# Patient Record
Sex: Male | Born: 1955 | Race: White | Hispanic: No | Marital: Married | Smoking: Never smoker
Health system: Southern US, Community
[De-identification: ages and names within clinical notes are randomized; demographics above are authoritative.]

## PROBLEM LIST (undated history)

## (undated) DIAGNOSIS — L0291 Cutaneous abscess, unspecified: Secondary | ICD-10-CM

## (undated) DIAGNOSIS — I10 Essential (primary) hypertension: Secondary | ICD-10-CM

## (undated) DIAGNOSIS — E785 Hyperlipidemia, unspecified: Secondary | ICD-10-CM

## (undated) HISTORY — PX: COLONOSCOPY W/ POLYPECTOMY: SHX1380

## (undated) HISTORY — DX: Cutaneous abscess, unspecified: L02.91

## (undated) HISTORY — DX: Essential (primary) hypertension: I10

## (undated) HISTORY — PX: TONSILLECTOMY: SUR1361

## (undated) HISTORY — DX: Hyperlipidemia, unspecified: E78.5

---

## 2006-06-29 ENCOUNTER — Encounter: Admission: RE | Admit: 2006-06-29 | Discharge: 2006-06-29 | Payer: Self-pay | Admitting: Family Medicine

## 2011-04-08 ENCOUNTER — Encounter (INDEPENDENT_AMBULATORY_CARE_PROVIDER_SITE_OTHER): Payer: Self-pay | Admitting: General Surgery

## 2011-04-08 ENCOUNTER — Other Ambulatory Visit (INDEPENDENT_AMBULATORY_CARE_PROVIDER_SITE_OTHER): Payer: Self-pay | Admitting: General Surgery

## 2011-04-08 ENCOUNTER — Telehealth (INDEPENDENT_AMBULATORY_CARE_PROVIDER_SITE_OTHER): Payer: Self-pay | Admitting: General Surgery

## 2011-04-08 ENCOUNTER — Ambulatory Visit (INDEPENDENT_AMBULATORY_CARE_PROVIDER_SITE_OTHER): Payer: 59 | Admitting: General Surgery

## 2011-04-08 VITALS — BP 118/80 | HR 84 | Temp 98.2°F | Resp 12 | Ht 65.0 in | Wt 165.0 lb

## 2011-04-08 DIAGNOSIS — L03119 Cellulitis of unspecified part of limb: Secondary | ICD-10-CM

## 2011-04-08 DIAGNOSIS — L02419 Cutaneous abscess of limb, unspecified: Secondary | ICD-10-CM

## 2011-04-08 MED ORDER — HYDROCODONE-ACETAMINOPHEN 5-500 MG PO TABS: 1.0000 | ORAL_TABLET | Freq: Every day | ORAL | Status: AC

## 2011-04-08 NOTE — Patient Instructions (Signed)
Remove packing in 3 days.  At that time may shower and pat wound dry.  Remove all soap from the wound.  Keep covered at all times.  No seawater exposure or pool water exposure,

## 2011-04-08 NOTE — Progress Notes (Signed)
HPI The patient comes in with a large right thigh abscess after weak bruit for several days. He has been on Septra antibiotics but it is worsened while on antibiotics. He may have resulted from a previous insect bite  PE About 10 cm in circumference abscess of the right anterior thigh. It is spontaneously draining dark brown pus. Cultures were sent.  Studiy review None  Assessment Large right thigh abscess  Plan And incision and drainage was done and a Betadine prep. Several cc of mucopurulent fluid was removed. Aerobic cultures were sent. The patient will remain on his Septra therapy. I did pack it with about 2 feet of half-inch iodoform gauze. This is to be removed in 3 days.  Unfortunately the patient is leaving to go out of the country tomorrow. I have advised him not to get in contact with any type of sea water were water however he can shower and pat the wound dry after he does remove the packing in 3 days.  The patient should come back to see [place] after he is done with his vacation  I will provide him with some Vicodin for pain control.

## 2011-04-11 LAB — WOUND CULTURE: Gram Stain: NONE SEEN

## 2011-04-19 ENCOUNTER — Ambulatory Visit (INDEPENDENT_AMBULATORY_CARE_PROVIDER_SITE_OTHER): Payer: 59 | Admitting: General Surgery

## 2011-04-19 ENCOUNTER — Encounter (INDEPENDENT_AMBULATORY_CARE_PROVIDER_SITE_OTHER): Payer: Self-pay | Admitting: General Surgery

## 2011-04-19 VITALS — BP 122/82 | HR 70 | Temp 98.2°F | Resp 16 | Ht 65.0 in | Wt 168.2 lb

## 2011-04-19 DIAGNOSIS — L03119 Cellulitis of unspecified part of limb: Secondary | ICD-10-CM

## 2011-04-19 DIAGNOSIS — L02419 Cutaneous abscess of limb, unspecified: Secondary | ICD-10-CM

## 2011-04-19 NOTE — Progress Notes (Signed)
HPI The patient's discomfort has significantly improved.  PE The amount of swelling and redness around the wound has significantly improved on his right side. It is still open in the center however he has been using peroxide to cleanse it.  Studiy review None.  Assessment Improved the right thigh abscess.  Plan Return to see me in 3 weeks for reassessment of the wound. He can discontinue his antibiotics. Should not use peroxide for the wound the distal water patted dry and then do a dressing change subsequently.

## 2011-06-07 ENCOUNTER — Encounter (INDEPENDENT_AMBULATORY_CARE_PROVIDER_SITE_OTHER): Payer: 59 | Admitting: General Surgery

## 2014-09-24 ENCOUNTER — Other Ambulatory Visit: Payer: Self-pay | Admitting: Gastroenterology

## 2014-11-28 ENCOUNTER — Other Ambulatory Visit: Payer: Self-pay | Admitting: Gastroenterology

## 2014-12-09 ENCOUNTER — Encounter (HOSPITAL_COMMUNITY): Payer: Self-pay | Admitting: *Deleted

## 2014-12-11 ENCOUNTER — Other Ambulatory Visit: Payer: Self-pay | Admitting: Gastroenterology

## 2014-12-14 ENCOUNTER — Encounter (HOSPITAL_COMMUNITY): Payer: Self-pay | Admitting: Anesthesiology

## 2014-12-14 NOTE — Anesthesia Preprocedure Evaluation (Deleted)
Anesthesia Evaluation  Patient identified by MRN, date of birth, ID band Patient awake    Reviewed: Allergy & Precautions, NPO status , Patient's Chart, lab work & pertinent test results  History of Anesthesia Complications Negative for: history of anesthetic complications  Airway Mallampati: II  TM Distance: >3 FB Neck ROM: Full    Dental no notable dental hx. (+) Dental Advisory Given   Pulmonary neg pulmonary ROS,  breath sounds clear to auscultation  Pulmonary exam normal       Cardiovascular hypertension, Pt. on medications Normal cardiovascular examRhythm:Regular Rate:Normal     Neuro/Psych negative neurological ROS  negative psych ROS   GI/Hepatic negative GI ROS, Neg liver ROS,   Endo/Other  negative endocrine ROS  Renal/GU negative Renal ROS  negative genitourinary   Musculoskeletal negative musculoskeletal ROS (+)   Abdominal   Peds negative pediatric ROS (+)  Hematology negative hematology ROS (+)   Anesthesia Other Findings   Reproductive/Obstetrics negative OB ROS                             Anesthesia Physical Anesthesia Plan  ASA: II  Anesthesia Plan: MAC   Post-op Pain Management:    Induction: Intravenous  Airway Management Planned: Nasal Cannula  Additional Equipment:   Intra-op Plan:   Post-operative Plan:   Informed Consent: I have reviewed the patients History and Physical, chart, labs and discussed the procedure including the risks, benefits and alternatives for the proposed anesthesia with the patient or authorized representative who has indicated his/her understanding and acceptance.   Dental advisory given  Plan Discussed with: CRNA  Anesthesia Plan Comments:         Anesthesia Quick Evaluation  

## 2014-12-15 ENCOUNTER — Other Ambulatory Visit: Payer: Self-pay | Admitting: Gastroenterology

## 2014-12-15 ENCOUNTER — Ambulatory Visit (HOSPITAL_COMMUNITY)
Admission: RE | Admit: 2014-12-15 | Payer: PRIVATE HEALTH INSURANCE | Source: Ambulatory Visit | Admitting: Gastroenterology

## 2014-12-15 SURGERY — COLONOSCOPY WITH PROPOFOL
Anesthesia: Monitor Anesthesia Care

## 2015-03-03 ENCOUNTER — Encounter (HOSPITAL_COMMUNITY): Admission: RE | Payer: Self-pay | Source: Ambulatory Visit

## 2015-03-03 ENCOUNTER — Ambulatory Visit (HOSPITAL_COMMUNITY)
Admission: RE | Admit: 2015-03-03 | Payer: PRIVATE HEALTH INSURANCE | Source: Ambulatory Visit | Admitting: Gastroenterology

## 2015-03-03 SURGERY — COLONOSCOPY WITH PROPOFOL
Anesthesia: Monitor Anesthesia Care

## 2019-02-15 ENCOUNTER — Emergency Department (HOSPITAL_COMMUNITY): Payer: 59

## 2019-02-15 ENCOUNTER — Emergency Department (HOSPITAL_COMMUNITY)
Admission: EM | Admit: 2019-02-15 | Discharge: 2019-02-16 | Disposition: A | Discharge status: Home / Self Care | Payer: 59 | Attending: Emergency Medicine | Admitting: Emergency Medicine

## 2019-02-15 DIAGNOSIS — R079 Chest pain, unspecified: Secondary | ICD-10-CM

## 2019-02-15 DIAGNOSIS — R0789 Other chest pain: Secondary | ICD-10-CM | POA: Insufficient documentation

## 2019-02-15 DIAGNOSIS — I10 Essential (primary) hypertension: Secondary | ICD-10-CM | POA: Diagnosis not present

## 2019-02-15 LAB — BASIC METABOLIC PANEL
Anion gap: 9 (ref 5–15)
BUN: 17 mg/dL (ref 8–23)
CO2: 23 mmol/L (ref 22–32)
Calcium: 9.8 mg/dL (ref 8.9–10.3)
Chloride: 106 mmol/L (ref 98–111)
Creatinine, Ser: 1.19 mg/dL (ref 0.61–1.24)
GFR calc Af Amer: >60 mL/min (ref >60)
GFR calc non Af Amer: >60 mL/min (ref >60)
Glucose, Bld: 115 mg/dL — ABNORMAL HIGH (ref 70–99)
Potassium: 4.4 mmol/L (ref 3.5–5.1)
Sodium: 138 mmol/L (ref 135–145)

## 2019-02-15 LAB — CBC
HCT: 43.9 % (ref 39.0–52.0)
Hemoglobin: 14.4 g/dL (ref 13.0–17.0)
MCH: 29.1 pg (ref 26.0–34.0)
MCHC: 32.8 g/dL (ref 30.0–36.0)
MCV: 88.9 fL (ref 80.0–100.0)
Platelets: 256 10*3/uL (ref 150–400)
RBC: 4.94 MIL/uL (ref 4.22–5.81)
RDW: 13.6 % (ref 11.5–15.5)
WBC: 7.6 10*3/uL (ref 4.0–10.5)
nRBC: 0 % (ref 0.0–0.2)

## 2019-02-15 LAB — TROPONIN I (HIGH SENSITIVITY): Troponin I (High Sensitivity): 3 ng/L (ref <18)

## 2019-02-15 MED ORDER — SODIUM CHLORIDE 0.9% FLUSH: 3.0000 mL | Freq: Once | INTRAVENOUS | Status: DC

## 2019-02-15 NOTE — ED Triage Notes (Signed)
Chest pain began yesterday, has gotten progressively worse, feels like something "pressing through to my back"

## 2019-02-15 NOTE — ED Notes (Signed)
PT states he is going outside and will return.

## 2019-02-16 LAB — TROPONIN I (HIGH SENSITIVITY): Troponin I (High Sensitivity): 3 ng/L (ref <18)

## 2019-02-16 NOTE — ED Notes (Signed)
C/o chest pain onset thurs with radiation to left arm and jaw, states he used Tylenol and Tums without relief. States pain is 3/10 at present.

## 2019-02-16 NOTE — Discharge Instructions (Addendum)
You were evaluated in the Emergency Department and after careful evaluation, we did not find any emergent condition requiring admission or further testing in the hospital.  Your exam/testing today was overall reassuring.  As discussed, we recommend outpatient cardiac stress testing within the next 1 to 2 weeks.  If your PCP is unable to schedule this, please contact the cardiologist office number provided.  Please return to the Emergency Department if you experience any worsening of your condition.  We encourage you to follow up with a primary care provider.  Thank you for allowing [place] to be a part of your care.

## 2019-02-16 NOTE — ED Provider Notes (Signed)
[facility] Emergency Department Provider Note MRN:  [id number]  Arrival date & time: 02/16/19     Chief Complaint   Chest Pain   History of Present Illness   Alejandro Edwards is a 63 y.o. year-old male with a history of hypertension, hyperlipidemia presenting to the ED with chief complaint of chest pain.  2 days ago patient began experiencing pain in the left arm radiating up into the left side of the chest.  Pain is described as a pressure.  At times worse with exertion.  Mild in severity.  Associated with nausea.  Denies shortness of breath, no leg pain or swelling, no fever, no cough, no abdominal pain.  No other exacerbating or alleviating factors.  Review of Systems  A complete 10 system review of systems was obtained and all systems are negative except as noted in the HPI and PMH.   Patient's Health History    Past Medical History:  Diagnosis Date  . Abscess    rt thigh-resolved-not a problem on 12-09-14  . Hyperlipidemia   . Hypertension     Past Surgical History:  Procedure Laterality Date  . COLONOSCOPY W/ POLYPECTOMY    . TONSILLECTOMY      No family history on file.  Social History   Socioeconomic History  . Marital status: Married    Spouse name: Not on file  . Number of children: Not on file  . Years of education: Not on file  . Highest education level: Not on file  Occupational History  . Not on file  Social Needs  . Financial resource strain: Not on file  . Food insecurity    Worry: Not on file    Inability: Not on file  . Transportation needs    Medical: Not on file    Non-medical: Not on file  Tobacco Use  . Smoking status: Never Smoker  Substance and Sexual Activity  . Alcohol use: Yes    Comment: occ. weekends  . Drug use: No  . Sexual activity: Not on file  Lifestyle  . Physical activity    Days per week: Not on file    Minutes per session: Not on file  . Stress: Not on file  Relationships  . Social Wellsite geologistconnections     Talks on phone: Not on file    Gets together: Not on file    Attends religious service: Not on file    Active member of club or organization: Not on file    Attends meetings of clubs or organizations: Not on file    Relationship status: Not on file  . Intimate partner violence    Fear of current or ex partner: Not on file    Emotionally abused: Not on file    Physically abused: Not on file    Forced sexual activity: Not on file  Other Topics Concern  . Not on file  Social History Narrative  . Not on file     Physical Exam  Vital Signs and Nursing Notes reviewed Vitals:   02/16/19 0416 02/16/19 0738  BP: 115/75 128/82  Pulse: 77 72  Resp: 18 16  Temp:  (!) 97.3 F (36.3 C)  SpO2: 100% 100%    CONSTITUTIONAL: Well-appearing, NAD NEURO:  Alert and oriented x 3, no focal deficits EYES:  eyes equal and reactive ENT/NECK:  no LAD, no JVD CARDIO: Regular rate, well-perfused, normal S1 and S2 PULM:  CTAB no wheezing or rhonchi GI/GU:  normal bowel sounds,  non-distended, non-tender MSK/SPINE:  No gross deformities, no edema SKIN:  no rash, atraumatic PSYCH:  Appropriate speech and behavior  Diagnostic and Interventional Summary    EKG Interpretation  Date/Time:  Friday February 15 2019 17:43:50 EDT Ventricular Rate:  84 PR Interval:  164 QRS Duration: 84 QT Interval:  360 QTC Calculation: 425 R Axis:   35 Text Interpretation:  Normal sinus rhythm Nonspecific ST abnormality Abnormal ECG Confirmed by Gerlene Fee [phone]) on 02/16/2019 7:32:55 AM      Labs Reviewed  BASIC METABOLIC PANEL - Abnormal; Notable for the following components:      Result Value   Glucose, Bld 115 (*)    All other components within normal limits  CBC  TROPONIN I (HIGH SENSITIVITY)  TROPONIN I (HIGH SENSITIVITY)    DG Chest 2 View  Final Result      Medications  sodium chloride flush (NS) 0.9 % injection 3 mL (has no administration in time range)     Procedures Critical Care   ED Course and Medical Decision Making  I have reviewed the triage vital signs and the nursing notes.  Pertinent labs & imaging results that were available during my care of the patient were reviewed by me and considered in my medical decision making (see below for details).  2 days of chest pain, patient has a heart score of 3 and while his description of pain is somewhat typical, he has no ischemic EKG findings and his troponins are negative x2.  No leg pain or swelling, no hypoxia, no shortness of breath, nothing to suggest PE.  Equal pulses, normal mediastinum on chest x-ray, nothing to suggest dissection.  There is no indication for further testing or admission to the hospital.  Patient would mostly benefit from outpatient stress testing.  Discussed this with patient, who agrees to follow-up with PCP to schedule outpatient cardiac stress testing within the next 1 to 2 weeks.  Patient advised to avoid strenuous activity.  Strict return precautions for worsening chest pain.    Barth Kirks. Sedonia Small, [place] [email or web address]  Final Clinical Impressions(s) / ED Diagnoses     ICD-10-CM   1. Chest pain, unspecified type  R07.9     ED Discharge Orders    None      Discharge Instructions Discussed with and Provided to Patient:   Discharge Instructions     You were evaluated in the Emergency Department and after careful evaluation, we did not find any emergent condition requiring admission or further testing in the hospital.  Your exam/testing today was overall reassuring.  As discussed, we recommend outpatient cardiac stress testing within the next 1 to 2 weeks.  If your PCP is unable to schedule this, please contact the cardiologist office number provided.  Please return to the Emergency Department if you experience any worsening of your condition.  We encourage you to follow up with a primary care provider.  Thank you for allowing [place]  to be a part of your care.        Maudie Flakes, MD 02/16/19 [phone]

## 2019-02-21 ENCOUNTER — Other Ambulatory Visit (HOSPITAL_COMMUNITY): Payer: Self-pay | Admitting: Physician Assistant

## 2019-02-21 DIAGNOSIS — R079 Chest pain, unspecified: Secondary | ICD-10-CM

## 2019-02-22 ENCOUNTER — Telehealth (HOSPITAL_COMMUNITY): Payer: Self-pay

## 2019-02-22 NOTE — Telephone Encounter (Signed)
Encounter complete. 

## 2019-02-25 ENCOUNTER — Other Ambulatory Visit (HOSPITAL_COMMUNITY)
Admission: RE | Admit: 2019-02-25 | Discharge: 2019-02-25 | Disposition: A | Discharge status: Home / Self Care | Payer: 59 | Source: Ambulatory Visit | Attending: Cardiology | Admitting: Cardiology

## 2019-02-25 DIAGNOSIS — Z01812 Encounter for preprocedural laboratory examination: Secondary | ICD-10-CM | POA: Insufficient documentation

## 2019-02-25 DIAGNOSIS — Z20828 Contact with and (suspected) exposure to other viral communicable diseases: Secondary | ICD-10-CM | POA: Diagnosis not present

## 2019-02-26 LAB — NOVEL CORONAVIRUS, NAA (HOSP ORDER, SEND-OUT TO REF LAB; TAT 18-24 HRS): SARS-CoV-2, NAA: NOT DETECTED

## 2019-02-28 ENCOUNTER — Encounter (INDEPENDENT_AMBULATORY_CARE_PROVIDER_SITE_OTHER): Payer: Self-pay

## 2019-02-28 ENCOUNTER — Ambulatory Visit (HOSPITAL_COMMUNITY)
Admission: RE | Admit: 2019-02-28 | Discharge: 2019-02-28 | Disposition: A | Discharge status: Home / Self Care | Payer: 59 | Source: Ambulatory Visit | Attending: Cardiology | Admitting: Cardiology

## 2019-02-28 ENCOUNTER — Other Ambulatory Visit: Payer: Self-pay

## 2019-02-28 DIAGNOSIS — R079 Chest pain, unspecified: Secondary | ICD-10-CM | POA: Diagnosis not present

## 2019-02-28 LAB — EXERCISE TOLERANCE TEST
Estimated workload: 10.7 METS
Exercise duration (min): 9 min
Exercise duration (sec): 21 s
MPHR: 158 {beats}/min
Peak HR: 160 {beats}/min
Percent HR: 101 %
RPE: 17
Rest HR: 88 {beats}/min

## 2019-03-12 ENCOUNTER — Encounter: Payer: Self-pay | Admitting: Cardiovascular Disease

## 2019-03-12 ENCOUNTER — Other Ambulatory Visit (HOSPITAL_COMMUNITY)
Admission: RE | Admit: 2019-03-12 | Discharge: 2019-03-12 | Disposition: A | Discharge status: Home / Self Care | Payer: 59 | Source: Ambulatory Visit | Attending: Cardiovascular Disease | Admitting: Cardiovascular Disease

## 2019-03-12 ENCOUNTER — Other Ambulatory Visit: Payer: Self-pay

## 2019-03-12 ENCOUNTER — Ambulatory Visit (INDEPENDENT_AMBULATORY_CARE_PROVIDER_SITE_OTHER): Payer: 59 | Admitting: Cardiovascular Disease

## 2019-03-12 ENCOUNTER — Other Ambulatory Visit: Payer: Self-pay | Admitting: Cardiovascular Disease

## 2019-03-12 VITALS — BP 126/76 | HR 92 | Temp 97.7°F | Ht 65.5 in

## 2019-03-12 DIAGNOSIS — Z01812 Encounter for preprocedural laboratory examination: Secondary | ICD-10-CM | POA: Insufficient documentation

## 2019-03-12 DIAGNOSIS — I200 Unstable angina: Secondary | ICD-10-CM | POA: Diagnosis not present

## 2019-03-12 DIAGNOSIS — Z20828 Contact with and (suspected) exposure to other viral communicable diseases: Secondary | ICD-10-CM | POA: Insufficient documentation

## 2019-03-12 DIAGNOSIS — R079 Chest pain, unspecified: Secondary | ICD-10-CM

## 2019-03-12 LAB — BASIC METABOLIC PANEL
Anion gap: 9 (ref 5–15)
BUN: 16 mg/dL (ref 8–23)
CO2: 22 mmol/L (ref 22–32)
Calcium: 9 mg/dL (ref 8.9–10.3)
Chloride: 109 mmol/L (ref 98–111)
Creatinine, Ser: 1.05 mg/dL (ref 0.61–1.24)
GFR calc Af Amer: >60 mL/min (ref >60)
GFR calc non Af Amer: >60 mL/min (ref >60)
Glucose, Bld: 120 mg/dL — ABNORMAL HIGH (ref 70–99)
Potassium: 3.9 mmol/L (ref 3.5–5.1)
Sodium: 140 mmol/L (ref 135–145)

## 2019-03-12 LAB — SARS CORONAVIRUS 2 (TAT 6-24 HRS): SARS Coronavirus 2: NEGATIVE

## 2019-03-12 NOTE — Progress Notes (Signed)
CARDIOLOGY CONSULT NOTE       Patient ID: ATZIN BUCHTA MRN: [id number] DOB/AGE: 62-Apr-1957 62 y.o. y.o.  Admit date: (Not on file) Referring Physician: Harle Battiest Primary Physician: Cristino Martes, MD Primary Cardiologist: New Reason for Consultation: Chest Pain  Active Problems:   * No active hospital problems. *   HPI:  63 y.o. with history of HTN, HLD  seen in ER for chest pain 02/16/19 Pain with some worrisome features Left sided exertional radiating to left arm. Pain for 2-3 days. Associated with mild nausea R/O no acute ECG changes He was d/c with no nitro or beta blocker and told to arrange stress testing with PCP  ETT done 02/28/19 reviewed Worrisome for 2 mm upsloping ST depression in inferior lateral leads Considerable exercise induced ventricular ectopy  Since ER visit has had persistent tightness in chest Some dyspnea He works at [employer] and pain Is not positional. No LE edema or abdominal symptoms   ROS All other systems reviewed and negative except as noted above  Past Medical History:  Diagnosis Date  . Abscess    rt thigh-resolved-not a problem on 12-09-14  . Hyperlipidemia   . Hypertension     History reviewed. No pertinent family history.  Social History   Socioeconomic History  . Marital status: Married    Spouse name: Not on file  . Number of children: Not on file  . Years of education: Not on file  . Highest education level: Not on file  Occupational History  . Not on file  Social Needs  . Financial resource strain: Not on file  . Food insecurity    Worry: Not on file    Inability: Not on file  . Transportation needs    Medical: Not on file    Non-medical: Not on file  Tobacco Use  . Smoking status: Never Smoker  . Smokeless tobacco: Never Used  Substance and Sexual Activity  . Alcohol use: Yes    Alcohol/week: 1.0 standard drinks    Types: 1 Cans of beer per week    Comment: occ. weekends  . Drug use: No  . Sexual activity: Not on  file  Lifestyle  . Physical activity    Days per week: Not on file    Minutes per session: Not on file  . Stress: Not on file  Relationships  . Social Herbalist on phone: Not on file    Gets together: Not on file    Attends religious service: Not on file    Active member of club or organization: Not on file    Attends meetings of clubs or organizations: Not on file    Relationship status: Not on file  . Intimate partner violence    Fear of current or ex partner: Not on file    Emotionally abused: Not on file    Physically abused: Not on file    Forced sexual activity: Not on file  Other Topics Concern  . Not on file  Social History Narrative  . Not on file    Past Surgical History:  Procedure Laterality Date  . COLONOSCOPY W/ POLYPECTOMY    . TONSILLECTOMY          Physical Exam: Blood pressure 126/76, pulse 92, temperature 97.7 F (36.5 C), temperature source Temporal, height 5' 5.5" (1.664 m).    Affect appropriate Healthy:  appears stated age 63: normal Neck supple with no adenopathy JVP normal no bruits no thyromegaly Lungs  clear with no wheezing and good diaphragmatic motion Heart:  S1/S2 no murmur, no rub, gallop or click PMI normal Abdomen: benighn, BS positve, no tenderness, no AAA no bruit.  No HSM or HJR Distal pulses intact with no bruits No edema Neuro non-focal Skin warm and dry No muscular weakness   Labs:   Lab Results  Component Value Date   WBC 7.6 02/15/2019   HGB 14.4 02/15/2019   HCT 43.9 02/15/2019   MCV 88.9 02/15/2019   PLT 256 02/15/2019     Radiology: Dg Chest 2 View  Result Date: 02/15/2019 CLINICAL DATA:  Chest pain EXAM: CHEST - 2 VIEW COMPARISON:  None. FINDINGS: The heart size and mediastinal contours are within normal limits. Both lungs are clear. The visualized skeletal structures are unremarkable. IMPRESSION: No active cardiopulmonary disease. Electronically Signed   By: Hetal  Patel   On: 02/15/2019  18:27    EKG: NSR normal ECG    ASSESSMENT AND PLAN:   1. HTN:  Well controlled.  Continue current medications and low sodium Dash type diet.   2. HLD continue statin target LDL 70 or less 3. Chest Pain: with abnormal stress test both in terms of ST segments and ventricular ectopy Start low dose lopressor Left heart cath arrange for am Discussed risks including stroke, MI, CABG bleeding and contrast reaction willing to proceed. Lab called orders written Will get pre cath labs at [facility] today If cath does not Show CAD may need further w/u to include CTA r/o PE and echo   Signed: Emiyah Spraggins 03/12/2019, 3:36 PM   

## 2019-03-12 NOTE — Patient Instructions (Addendum)
Medication Instructions:      Labwork: TODAY  COVID TEST  BMET    Testing/Procedures: Your physician has requested that you have a cardiac catheterization. Cardiac catheterization is used to diagnose and/or treat various heart conditions. Doctors may recommend this procedure for a number of different reasons. The most common reason is to evaluate chest pain. Chest pain can be a symptom of coronary artery disease (CAD), and cardiac catheterization can show whether plaque is narrowing or blocking your heart's arteries. This procedure is also used to evaluate the valves, as well as measure the blood flow and oxygen levels in different parts of your heart. For further information please visit [email or web address]. Please follow instruction sheet, as given.    Follow-Up: Your physician recommends that you schedule a follow-up appointment in: POST CATH    Any Other Special Instructions Will Be Listed Below (If Applicable).     If you need a refill on your cardiac medications before your next appointment, please call your pharmacy.

## 2019-03-12 NOTE — H&P (View-Only) (Signed)
CARDIOLOGY CONSULT NOTE       Patient ID: ATZIN BUCHTA MRN: [id number] DOB/AGE: 62-Apr-1957 62 y.o.  Admit date: (Not on file) Referring Physician: Harle Battiest Primary Physician: Cristino Martes, MD Primary Cardiologist: New Reason for Consultation: Chest Pain  Active Problems:   * No active hospital problems. *   HPI:  63 y.o. with history of HTN, HLD  seen in ER for chest pain 02/16/19 Pain with some worrisome features Left sided exertional radiating to left arm. Pain for 2-3 days. Associated with mild nausea R/O no acute ECG changes He was d/c with no nitro or beta blocker and told to arrange stress testing with PCP  ETT done 02/28/19 reviewed Worrisome for 2 mm upsloping ST depression in inferior lateral leads Considerable exercise induced ventricular ectopy  Since ER visit has had persistent tightness in chest Some dyspnea He works at [employer] and pain Is not positional. No LE edema or abdominal symptoms   ROS All other systems reviewed and negative except as noted above  Past Medical History:  Diagnosis Date  . Abscess    rt thigh-resolved-not a problem on 12-09-14  . Hyperlipidemia   . Hypertension     History reviewed. No pertinent family history.  Social History   Socioeconomic History  . Marital status: Married    Spouse name: Not on file  . Number of children: Not on file  . Years of education: Not on file  . Highest education level: Not on file  Occupational History  . Not on file  Social Needs  . Financial resource strain: Not on file  . Food insecurity    Worry: Not on file    Inability: Not on file  . Transportation needs    Medical: Not on file    Non-medical: Not on file  Tobacco Use  . Smoking status: Never Smoker  . Smokeless tobacco: Never Used  Substance and Sexual Activity  . Alcohol use: Yes    Alcohol/week: 1.0 standard drinks    Types: 1 Cans of beer per week    Comment: occ. weekends  . Drug use: No  . Sexual activity: Not on  file  Lifestyle  . Physical activity    Days per week: Not on file    Minutes per session: Not on file  . Stress: Not on file  Relationships  . Social Herbalist on phone: Not on file    Gets together: Not on file    Attends religious service: Not on file    Active member of club or organization: Not on file    Attends meetings of clubs or organizations: Not on file    Relationship status: Not on file  . Intimate partner violence    Fear of current or ex partner: Not on file    Emotionally abused: Not on file    Physically abused: Not on file    Forced sexual activity: Not on file  Other Topics Concern  . Not on file  Social History Narrative  . Not on file    Past Surgical History:  Procedure Laterality Date  . COLONOSCOPY W/ POLYPECTOMY    . TONSILLECTOMY          Physical Exam: Blood pressure 126/76, pulse 92, temperature 97.7 F (36.5 C), temperature source Temporal, height 5' 5.5" (1.664 m).    Affect appropriate Healthy:  appears stated age 63: normal Neck supple with no adenopathy JVP normal no bruits no thyromegaly Lungs  clear with no wheezing and good diaphragmatic motion Heart:  S1/S2 no murmur, no rub, gallop or click PMI normal Abdomen: benighn, BS positve, no tenderness, no AAA no bruit.  No HSM or HJR Distal pulses intact with no bruits No edema Neuro non-focal Skin warm and dry No muscular weakness   Labs:   Lab Results  Component Value Date   WBC 7.6 02/15/2019   HGB 14.4 02/15/2019   HCT 43.9 02/15/2019   MCV 88.9 02/15/2019   PLT 256 02/15/2019     Radiology: Dg Chest 2 View  Result Date: 02/15/2019 CLINICAL DATA:  Chest pain EXAM: CHEST - 2 VIEW COMPARISON:  None. FINDINGS: The heart size and mediastinal contours are within normal limits. Both lungs are clear. The visualized skeletal structures are unremarkable. IMPRESSION: No active cardiopulmonary disease. Electronically Signed   By: Elige Ko   On: 02/15/2019  18:27    EKG: NSR normal ECG    ASSESSMENT AND PLAN:   1. HTN:  Well controlled.  Continue current medications and low sodium Dash type diet.   2. HLD continue statin target LDL 70 or less 3. Chest Pain: with abnormal stress test both in terms of ST segments and ventricular ectopy Start low dose lopressor Left heart cath arrange for am Discussed risks including stroke, MI, CABG bleeding and contrast reaction willing to proceed. Lab called orders written Will get pre cath labs at [facility] today If cath does not Show CAD may need further w/u to include CTA r/o PE and echo   Signed: Charlton Haws 03/12/2019, 3:36 PM

## 2019-03-13 ENCOUNTER — Telehealth: Payer: Self-pay | Admitting: *Deleted

## 2019-03-13 NOTE — Telephone Encounter (Signed)
Pt contacted pre-catheterization scheduled at [facility] for: Thursday March 14, 2019 9 AM Verified arrival time and place: [place] [facility]) at: 7AM   No solid food after midnight prior to cath, clear liquids until 5 AM day of procedure. Contrast allergy: no   AM meds can be  taken pre-cath with sip of water including: ASA 81 mg   Confirmed patient has responsible adult to drive home post procedure and observe 24 hours after arriving home: yes  Currently, due to Covid-19 pandemic, only one support person will be allowed with patient. Must be the same support person for that patient's entire stay, will be screened and required to wear a mask. They will be asked to wait in the waiting room for the duration of the patient's stay.  Patients are required to wear a mask when they enter the hospital.      COVID-19 Pre-Screening Questions:  . In the past 7 to 10 days have you had a cough,  shortness of breath, headache, congestion, fever (100 or greater) body aches, chills, sore throat, or sudden loss of taste or sense of smell? no . Have you been around anyone with known Covid 19? no . Have you been around anyone who is awaiting Covid 19 test results in the past 7 to 10 days? no . Have you been around anyone who has been exposed to Covid 19, or has mentioned symptoms of Covid 19 within the past 7 to 10 days? no  I reviewed procedure/mask/visitor instructions, Covid-19 screening questions with patient, he verbalized understanding, thanked me for call.

## 2019-03-14 ENCOUNTER — Encounter (HOSPITAL_COMMUNITY)
Admission: RE | Disposition: A | Discharge status: Home / Self Care | Payer: Self-pay | Source: Home / Self Care | Attending: Interventional Cardiology

## 2019-03-14 ENCOUNTER — Other Ambulatory Visit: Payer: Self-pay

## 2019-03-14 ENCOUNTER — Ambulatory Visit (HOSPITAL_COMMUNITY)
Admission: RE | Admit: 2019-03-14 | Discharge: 2019-03-14 | Disposition: A | Discharge status: Home / Self Care | Payer: 59 | Attending: Interventional Cardiology | Admitting: Interventional Cardiology

## 2019-03-14 DIAGNOSIS — R0600 Dyspnea, unspecified: Secondary | ICD-10-CM | POA: Diagnosis not present

## 2019-03-14 DIAGNOSIS — I209 Angina pectoris, unspecified: Secondary | ICD-10-CM

## 2019-03-14 DIAGNOSIS — R079 Chest pain, unspecified: Secondary | ICD-10-CM

## 2019-03-14 DIAGNOSIS — I10 Essential (primary) hypertension: Secondary | ICD-10-CM | POA: Insufficient documentation

## 2019-03-14 DIAGNOSIS — E785 Hyperlipidemia, unspecified: Secondary | ICD-10-CM | POA: Insufficient documentation

## 2019-03-14 DIAGNOSIS — R9439 Abnormal result of other cardiovascular function study: Secondary | ICD-10-CM | POA: Diagnosis not present

## 2019-03-14 HISTORY — PX: LEFT HEART CATH AND CORONARY ANGIOGRAPHY: CATH118249

## 2019-03-14 SURGERY — LEFT HEART CATH AND CORONARY ANGIOGRAPHY
Anesthesia: LOCAL

## 2019-03-14 MED ORDER — VERAPAMIL HCL 2.5 MG/ML IV SOLN
INTRAVENOUS | Status: DC | PRN
  Administered 2019-03-14: 10 mL via INTRA_ARTERIAL

## 2019-03-14 MED ORDER — ASPIRIN 81 MG PO CHEW: 81.0000 mg | CHEWABLE_TABLET | ORAL | Status: DC

## 2019-03-14 MED ORDER — HYDRALAZINE HCL 20 MG/ML IJ SOLN: 10.0000 mg | INTRAMUSCULAR | Status: DC | PRN

## 2019-03-14 MED ORDER — ACETAMINOPHEN 325 MG PO TABS: 650.0000 mg | ORAL_TABLET | ORAL | Status: DC | PRN

## 2019-03-14 MED ORDER — IOHEXOL 350 MG/ML SOLN
INTRAVENOUS | Status: DC | PRN
  Administered 2019-03-14: 80 mL

## 2019-03-14 MED ORDER — ATORVASTATIN CALCIUM 80 MG PO TABS: 80.0000 mg | ORAL_TABLET | Freq: Every day | ORAL | Status: DC

## 2019-03-14 MED ORDER — SODIUM CHLORIDE 0.9% FLUSH: 3.0000 mL | Freq: Two times a day (BID) | INTRAVENOUS | Status: DC

## 2019-03-14 MED ORDER — LABETALOL HCL 5 MG/ML IV SOLN: 10.0000 mg | INTRAVENOUS | Status: DC | PRN

## 2019-03-14 MED ORDER — ASPIRIN 81 MG PO CHEW: 81.0000 mg | CHEWABLE_TABLET | Freq: Every day | ORAL | Status: DC

## 2019-03-14 MED ORDER — HEPARIN SODIUM (PORCINE) 1000 UNIT/ML IJ SOLN
INTRAMUSCULAR | Status: AC
  Filled 2019-03-14: qty 1

## 2019-03-14 MED ORDER — TICAGRELOR 90 MG PO TABS: 90.0000 mg | ORAL_TABLET | Freq: Two times a day (BID) | ORAL | Status: DC

## 2019-03-14 MED ORDER — SODIUM CHLORIDE 0.9% FLUSH: 3.0000 mL | INTRAVENOUS | Status: DC | PRN

## 2019-03-14 MED ORDER — SODIUM CHLORIDE 0.9 % IV SOLN: 250.0000 mL | INTRAVENOUS | Status: DC | PRN

## 2019-03-14 MED ORDER — HEPARIN (PORCINE) IN NACL 1000-0.9 UT/500ML-% IV SOLN
INTRAVENOUS | Status: DC | PRN
  Administered 2019-03-14: 500 mL

## 2019-03-14 MED ORDER — HEPARIN (PORCINE) IN NACL 1000-0.9 UT/500ML-% IV SOLN
INTRAVENOUS | Status: AC
  Filled 2019-03-14: qty 1000

## 2019-03-14 MED ORDER — ONDANSETRON HCL 4 MG/2ML IJ SOLN: 4.0000 mg | Freq: Four times a day (QID) | INTRAMUSCULAR | Status: DC | PRN

## 2019-03-14 MED ORDER — MIDAZOLAM HCL 2 MG/2ML IJ SOLN
INTRAMUSCULAR | Status: DC | PRN
  Administered 2019-03-14: 1 mg via INTRAVENOUS

## 2019-03-14 MED ORDER — SODIUM CHLORIDE 0.9 % WEIGHT BASED INFUSION: 1.0000 mL/kg/h | INTRAVENOUS | Status: DC

## 2019-03-14 MED ORDER — FENTANYL CITRATE (PF) 100 MCG/2ML IJ SOLN
INTRAMUSCULAR | Status: AC
  Filled 2019-03-14: qty 2

## 2019-03-14 MED ORDER — SODIUM CHLORIDE 0.9 % WEIGHT BASED INFUSION
3.0000 mL/kg/h | INTRAVENOUS | Status: AC
  Administered 2019-03-14: 3 mL/kg/h via INTRAVENOUS

## 2019-03-14 MED ORDER — MIDAZOLAM HCL 2 MG/2ML IJ SOLN
INTRAMUSCULAR | Status: AC
  Filled 2019-03-14: qty 2

## 2019-03-14 MED ORDER — LIDOCAINE HCL (PF) 1 % IJ SOLN
INTRAMUSCULAR | Status: AC
  Filled 2019-03-14: qty 30

## 2019-03-14 MED ORDER — HEPARIN SODIUM (PORCINE) 1000 UNIT/ML IJ SOLN
INTRAMUSCULAR | Status: DC | PRN
  Administered 2019-03-14: 4000 [IU] via INTRAVENOUS

## 2019-03-14 MED ORDER — FENTANYL CITRATE (PF) 100 MCG/2ML IJ SOLN
INTRAMUSCULAR | Status: DC | PRN
  Administered 2019-03-14: 25 ug via INTRAVENOUS

## 2019-03-14 MED ORDER — VERAPAMIL HCL 2.5 MG/ML IV SOLN
INTRAVENOUS | Status: AC
  Filled 2019-03-14: qty 2

## 2019-03-14 MED ORDER — OXYCODONE HCL 5 MG PO TABS: 5.0000 mg | ORAL_TABLET | ORAL | Status: DC | PRN

## 2019-03-14 MED ORDER — LIDOCAINE HCL (PF) 1 % IJ SOLN
INTRAMUSCULAR | Status: DC | PRN
  Administered 2019-03-14: 4 mL

## 2019-03-14 SURGICAL SUPPLY — 10 items
CATH 5FR JL3.5 JR4 ANG PIG MP (CATHETERS) ×1 IMPLANT
DEVICE RAD COMP TR BAND LRG (VASCULAR PRODUCTS) ×1 IMPLANT
GLIDESHEATH SLEND A-KIT 6F 22G (SHEATH) ×1 IMPLANT
GUIDEWIRE INQWIRE 1.5J.035X260 (WIRE) IMPLANT
INQWIRE 1.5J .035X260CM (WIRE) ×2
KIT HEART LEFT (KITS) ×2 IMPLANT
PACK CARDIAC CATHETERIZATION (CUSTOM PROCEDURE TRAY) ×2 IMPLANT
SHEATH PROBE COVER 6X72 (BAG) ×1 IMPLANT
TRANSDUCER W/STOPCOCK (MISCELLANEOUS) ×2 IMPLANT
TUBING CIL FLEX 10 FLL-RA (TUBING) ×2 IMPLANT

## 2019-03-14 NOTE — Progress Notes (Signed)
Pt without significant disease. Will not see.  Yves Dill CES, ACSM 11:08 AM 03/14/2019

## 2019-03-14 NOTE — CV Procedure (Signed)
   Left heart cath with coronary angiography and left ventriculography via right radial.  Real-time vessel ultrasound used for access guidance.  Normal left main  Normal LAD with 40 to 75% systolic compression of the LAD just beyond the second diagonal.  No diastolic narrowing noted.  Normal circumflex  Normal right coronary  Normal LV function and LVEDP  RECOMMENDATIONS: Probable false positive stress test versus microvascular disease.  For the evaluation per Dr. Admission.

## 2019-03-14 NOTE — Discharge Instructions (Signed)
Radial Site Care ° °This sheet gives you information about how to care for yourself after your procedure. Your health care provider may also give you more specific instructions. If you have problems or questions, contact your health care provider. °What can I expect after the procedure? °After the procedure, it is common to have: °· Bruising and tenderness at the catheter insertion area. °Follow these instructions at home: °Medicines °· Take over-the-counter and prescription medicines only as told by your health care provider. °Insertion site care °· Follow instructions from your health care provider about how to take care of your insertion site. Make sure you: °? Wash your hands with soap and water before you change your bandage (dressing). If soap and water are not available, use hand sanitizer. °? Change your dressing as told by your health care provider. °? Leave stitches (sutures), skin glue, or adhesive strips in place. These skin closures may need to stay in place for 2 weeks or longer. If adhesive strip edges start to loosen and curl up, you may trim the loose edges. Do not remove adhesive strips completely unless your health care provider tells you to do that. °· Check your insertion site every day for signs of infection. Check for: °? Redness, swelling, or pain. °? Fluid or blood. °? Pus or a bad smell. °? Warmth. °· Do not take baths, swim, or use a hot tub until your health care provider approves. °· You may shower 24-48 hours after the procedure, or as directed by your health care provider. °? Remove the dressing and gently wash the site with plain soap and water. °? Pat the area dry with a clean towel. °? Do not rub the site. That could cause bleeding. °· Do not apply powder or lotion to the site. °Activity ° °· For 24 hours after the procedure, or as directed by your health care provider: °? Do not flex or bend the affected arm. °? Do not push or pull heavy objects with the affected arm. °? Do not  drive yourself home from the hospital or clinic. You may drive 24 hours after the procedure unless your health care provider tells you not to. °? Do not operate machinery or power tools. °· Do not lift anything that is heavier than 10 lb (4.5 kg), or the limit that you are told, until your health care provider says that it is safe. °· Ask your health care provider when it is okay to: °? Return to work or school. °? Resume usual physical activities or sports. °? Resume sexual activity. °General instructions °· If the catheter site starts to bleed, raise your arm and put firm pressure on the site. If the bleeding does not stop, get help right away. This is a medical emergency. °· If you went home on the same day as your procedure, a responsible adult should be with you for the first 24 hours after you arrive home. °· Keep all follow-up visits as told by your health care provider. This is important. °Contact a health care provider if: °· You have a fever. °· You have redness, swelling, or yellow drainage around your insertion site. °Get help right away if: °· You have unusual pain at the radial site. °· The catheter insertion area swells very fast. °· The insertion area is bleeding, and the bleeding does not stop when you hold steady pressure on the area. °· Your arm or hand becomes pale, cool, tingly, or numb. °These symptoms may represent a serious problem   that is an emergency. Do not wait to see if the symptoms will go away. Get medical help right away. Call your local emergency services (911 in the [place] Do not drive yourself to the hospital. °Summary °· After the procedure, it is common to have bruising and tenderness at the site. °· Follow instructions from your health care provider about how to take care of your radial site wound. Check the wound every day for signs of infection. °· Do not lift anything that is heavier than 10 lb (4.5 kg), or the limit that you are told, until your health care provider says  that it is safe. °This information is not intended to replace advice given to you by your health care provider. Make sure you discuss any questions you have with your health care provider. °Document Released: 06/18/2010 Document Revised: 06/21/2017 Document Reviewed: 06/21/2017 °Elsevier Patient Education © 2020 [employer] ° °

## 2019-03-14 NOTE — Interval H&P Note (Signed)
Cath Lab Visit (complete for each Cath Lab visit)  Clinical Evaluation Leading to the Procedure:   ACS: No.  Non-ACS:    Anginal Classification: CCS III  Anti-ischemic medical therapy: Minimal Therapy (1 class of medications)  Non-Invasive Test Results: High-risk stress test findings: cardiac mortality >3%/year  Prior CABG: No previous CABG      History and Physical Interval Note:  03/14/2019 8:57 AM  Alejandro Edwards  has presented today for surgery, with the diagnosis of chest pain - unstable angina.  The various methods of treatment have been discussed with the patient and family. After consideration of risks, benefits and other options for treatment, the patient has consented to  Procedure(s): LEFT HEART CATH AND CORONARY ANGIOGRAPHY (N/A) as a surgical intervention.  The patient's history has been reviewed, patient examined, no change in status, stable for surgery.  I have reviewed the patient's chart and labs.  Questions were answered to the patient's satisfaction.     Belva Crome III

## 2019-03-15 ENCOUNTER — Encounter (HOSPITAL_COMMUNITY): Payer: Self-pay | Admitting: Interventional Cardiology

## 2019-03-21 ENCOUNTER — Ambulatory Visit: Payer: 59 | Admitting: Cardiovascular Disease

## 2019-03-26 ENCOUNTER — Ambulatory Visit: Payer: 59 | Admitting: Cardiovascular Disease

## 2019-04-12 ENCOUNTER — Encounter: Payer: Self-pay | Admitting: Student

## 2019-04-12 ENCOUNTER — Other Ambulatory Visit: Payer: Self-pay

## 2019-04-12 ENCOUNTER — Telehealth (INDEPENDENT_AMBULATORY_CARE_PROVIDER_SITE_OTHER): Payer: 59 | Admitting: Student

## 2019-04-12 VITALS — BP 121/74 | HR 86 | Ht 65.0 in | Wt 165.0 lb

## 2019-04-12 DIAGNOSIS — R079 Chest pain, unspecified: Secondary | ICD-10-CM | POA: Diagnosis not present

## 2019-04-12 DIAGNOSIS — I10 Essential (primary) hypertension: Secondary | ICD-10-CM

## 2019-04-12 DIAGNOSIS — E782 Mixed hyperlipidemia: Secondary | ICD-10-CM

## 2019-04-12 NOTE — Progress Notes (Signed)
Virtual Visit via Telephone Note   This visit type was conducted due to national recommendations for restrictions regarding the COVID-19 Pandemic (e.g. social distancing) in an effort to limit this patient's exposure and mitigate transmission in our community.  Due to his co-morbid illnesses, this patient is at least at moderate risk for complications without adequate follow up.  This format is felt to be most appropriate for this patient at this time.  The patient did not have access to video technology/had technical difficulties with video requiring transitioning to audio format only (telephone).  All issues noted in this document were discussed and addressed.  No physical exam could be performed with this format.  Please refer to the patient's chart for his  consent to telehealth for [facility].   Date:  04/12/2019   ID:  Damita Lack, DOB January 04, 1956, MRN [id number]  Patient Location: Home Provider Location: Office  PCP:  Mickie Hillier, MD  Cardiologist:  Charlton Haws, MD  Electrophysiologist:  None   Evaluation Performed:  Follow-Up Visit  Chief Complaint:  S/p Cardiac Catheterization  History of Present Illness:    Alejandro Edwards is a 63 y.o. male with with past medical history of HTN and HLD who presents for a follow-up telehealth visit in regards to his recent cardiac catheterization.  The patient had initially been evaluated in the ED in 01/2019 for episodes of chest pain and an outpatient stress test was recommended for further evaluation. He underwent an ETT on 02/28/2019 which showed 2 mm ST depression in the inferior lateral leads along with increasing PVC's with exercise which was concerning for ischemia. He did follow-up with Dr. Eden Emms on 03/12/2019 and a cardiac catheterization was recommended for definitive evaluation. He was started on Lopressor 12.5 mg twice daily and continued on statin therapy.  His cardiac catheterization was performed by Dr. Katrinka Blazing on  03/14/2019 and showed normal LV function with a preserved EF of 65% and normal LVEDP. LAD, LCx and RCA were normal and just distal to the second diagonal in the mid LAD he did have a 60% systolic compression with normal lumen during diastole. It was felt that his false positive EKG changes were possibly secondary to microvascular CAD.  In talking with the patient today, he reports overall doing well since his recent cardiac catheterization. He denies any recurrent episodes of chest pain resembling what prompted him to go to the ED in 01/2019. He does report an occasional discomfort along his sternal region after consuming food but this spontaneously resolved and he has not had to utilize any antacids. He denies any change in his dyspnea on exertion. No recent palpitations, orthopnea, PND or lower extremity edema.  No complications regarding his radial cath site.  The patient does not have symptoms concerning for COVID-19 infection (fever, chills, cough, or new shortness of breath).    Past Medical History:  Diagnosis Date  . Abscess    rt thigh-resolved-not a problem on 12-09-14  . Hyperlipidemia   . Hypertension    Past Surgical History:  Procedure Laterality Date  . COLONOSCOPY W/ POLYPECTOMY    . LEFT HEART CATH AND CORONARY ANGIOGRAPHY N/A 03/14/2019   Procedure: LEFT HEART CATH AND CORONARY ANGIOGRAPHY;  Surgeon: Lyn Records, MD;  Location: [facility] INV[facility]IVE [facility] LAB;  Service: Cardiovascular;  Laterality: N/A;  . TONSILLECTOMY       Current Meds  Medication Sig  . atorvastatin (LIPITOR) 40 MG tablet Take 40 mg by mouth daily.  Marland Kitchen lisinopril (  PRINIVIL,ZESTRIL) 20 MG tablet Take 20 mg by mouth every morning.      Allergies:   Patient has no known allergies.   Social History   Tobacco Use  . Smoking status: Never Smoker  . Smokeless tobacco: Never Used  Substance Use Topics  . Alcohol use: Yes    Alcohol/week: 1.0 standard drinks    Types: 1 Cans of beer per week    Comment: occ.  weekends  . Drug use: No     Family Hx: The patient's family history includes Heart attack in his cousin.  ROS:   Please see the history of present illness.     All other systems reviewed and are negative.   Prior CV studies:   The following studies were reviewed today:  ETT: 02/28/2019  Blood pressure demonstrated a normal response to exercise.  Upsloping ST segment depression ST segment depression was noted during stress, and returning to baseline after 1-5 minutes of recovery.  Frequent PVCs during exercise and into recovery.   Negative adequate for ST changes of ischemia, but very concerning increase in frequency of PVCs with exercise, up to 37/minute, which persisted into recovery.  Cardiac Catheterization: 03/14/2019  Normal left ventricular systolic function, EF 70%.  Normal LVEDP.  Right dominant coronary anatomy.  Normal left main  Normal LAD.  Just distal to the second diagonal in the mid LAD there is 35% systolic compression with normal lumen during diastole.  Circumflex is normal.  Right coronary is normal and dominant.  RECOMMENDATIONS:   Further evaluation per Dr. Johnsie Cancel  False positive EKG changes for ischemia versus microvascular coronary disease.  Labs/Other Tests and Data Reviewed:    EKG:  No ECG reviewed.  Recent Labs: 02/15/2019: Hemoglobin 14.4; Platelets 256 03/12/2019: BUN 16; Creatinine, Ser 1.05; Potassium 3.9; Sodium 140   Recent Lipid Panel No results found for: CHOL, TRIG, HDL, CHOLHDL, LDLCALC, LDLDIRECT  Wt Readings from Last 3 Encounters:  04/12/19 165 lb (74.8 kg)  03/14/19 165 lb (74.8 kg)  04/19/11 168 lb 3.2 oz (76.3 kg)     Objective:    Vital Signs:  BP 121/74   Pulse 86   Ht 5\' 5"  (1.651 m)   Wt 165 lb (74.8 kg)   BMI 27.46 kg/m    General: Pleasant male sounding in NAD Psych: Normal affect. Neuro: Alert and oriented X 3 Lungs:  Resp regular and unlabored while talking on the phone.   ASSESSMENT &  PLAN:    1. Chest Pain - recent catheterization showed LAD, LCx and RCA were normal and just distal to the second diagonal in the mid LAD he did have a 00% systolic compression with normal lumen during diastole. Reviewed catheterization results in detail with the patient today.  He denies any recurrent exertional chest pain. We reviewed that if he does have recurrent symptoms, would consider initiation of beta-blocker therapy given possible myocardial bridge by catheterization. He wishes to avoid additional medical therapy for now given his stable symptoms. - Continue with risk factor modification at this time.  2. HTN - BP has been well controlled when checked at home and was at 121/74 on most recent check. Continue current regimen with Lisinopril 14m daily.   3. HLD - Followed by PCP.  He remains on Atorvastatin 40 mg daily.  COVID-19 Education: The signs and symptoms of COVID-19 were discussed with the patient and how to seek care for testing (follow up with PCP or arrange E-visit).  The importance of social  distancing was discussed today.  Time:   Today, I have spent 15 minutes with the patient with telehealth technology discussing the above problems.     Medication Adjustments/Labs and Tests Ordered: Current medicines are reviewed at length with the patient today.  Concerns regarding medicines are outlined above.   Tests Ordered: No orders of the defined types were placed in this encounter.   Medication Changes: No orders of the defined types were placed in this encounter.   Follow Up: In 1 year  Signed, Ellsworth LennoxBrittany M Stepen Prins, PA-C  04/12/2019 5:02 PM    [facility]

## 2019-04-12 NOTE — Patient Instructions (Signed)
Medication Instructions:  Your physician recommends that you continue on your current medications as directed. Please refer to the Current Medication list given to you today.  *If you need a refill on your cardiac medications before your next appointment, please call your pharmacy*  Lab Work: NONE  If you have labs (blood work) drawn today and your tests are completely normal, you will receive your results only by: Marland Kitchen MyChart Message (if you have MyChart) OR . A paper copy in the mail If you have any lab test that is abnormal or we need to change your treatment, we will call you to review the results.  Testing/Procedures: NONE   Follow-Up: At [facility], you and your health needs are our priority.  As part of our continuing mission to provide you with exceptional heart care, we have created designated Provider Care Teams.  These Care Teams include your primary Cardiologist (physician) and Advanced Practice Providers (APPs -  Physician Assistants and Nurse Practitioners) who all work together to provide you with the care you need, when you need it.  Your next appointment:   12 months  The format for your next appointment:   In Person  Provider:   Jenkins Rouge, MD  Other Instructions Thank you for choosing [facility]!

## 2020-10-13 ENCOUNTER — Other Ambulatory Visit: Payer: Self-pay

## 2020-10-13 ENCOUNTER — Telehealth (INDEPENDENT_AMBULATORY_CARE_PROVIDER_SITE_OTHER): Payer: BLUE CROSS/BLUE SHIELD | Admitting: Cardiovascular Disease

## 2020-10-13 ENCOUNTER — Encounter: Payer: Self-pay | Admitting: Cardiovascular Disease

## 2020-10-13 VITALS — Ht 66.0 in | Wt 165.0 lb

## 2020-10-13 DIAGNOSIS — I10 Essential (primary) hypertension: Secondary | ICD-10-CM | POA: Diagnosis not present

## 2020-10-13 DIAGNOSIS — E782 Mixed hyperlipidemia: Secondary | ICD-10-CM | POA: Diagnosis not present

## 2020-10-13 DIAGNOSIS — R079 Chest pain, unspecified: Secondary | ICD-10-CM | POA: Diagnosis not present

## 2020-10-13 NOTE — Patient Instructions (Addendum)

## 2020-10-13 NOTE — Progress Notes (Signed)
Virtual Visit via Telephone Note   This visit type was conducted due to national recommendations for restrictions regarding the COVID-19 Pandemic (e.g. social distancing) in an effort to limit this patient's exposure and mitigate transmission in our community.  Due to his co-morbid illnesses, this patient is at least at moderate risk for complications without adequate follow up.  This format is felt to be most appropriate for this patient at this time.  The patient did not have access to video technology/had technical difficulties with video requiring transitioning to audio format only (telephone).  All issues noted in this document were discussed and addressed.  No physical exam could be performed with this format.  Please refer to the patient's chart for his  consent to telehealth for [facility].   Date:  10/13/2020   ID:  Alejandro Edwards, DOB [id number], MRN [id number]  Patient Location: Home Provider Location: Office  PCP:  Mickie Hillier, MD  Cardiologist:  Charlton Haws, MD  Electrophysiologist:  None   Evaluation Performed:  Follow-Up Visit  Chief Complaint:  Chest Pain   History of Present Illness:    65 y.o. with history of chest pain, HTN, HLD. First seen in [facility] September 2020 abnormal ETT 02/28/19 With [facility] Cath Dr Katrinka Blazing 03/14/19 EF normal 65% just some distal LAD bridging Has been compliant With meds. Works at a [employer] in [place]. Has had COVID vaccine Due to have blood work with primary Next month No angina, dyspnea palpitations or syncope  Has a trip to [place] planned for the summer     Past Medical History:  Diagnosis Date  . Abscess    rt thigh-resolved-not a problem on 12-09-14  . Hyperlipidemia   . Hypertension    Past Surgical History:  Procedure Laterality Date  . COLONOSCOPY W/ POLYPECTOMY    . LEFT HEART CATH AND CORONARY ANGIOGRAPHY N/A 03/14/2019   Procedure: LEFT HEART CATH AND CORONARY ANGIOGRAPHY;  Surgeon: Lyn Records, MD;  Location:  [facility] LAB;  Service: Cardiovascular;  Laterality: N/A;  . TONSILLECTOMY       Current Meds  Medication Sig  . atorvastatin (LIPITOR) 40 MG tablet Take 40 mg by mouth daily.  Marland Kitchen lisinopril (PRINIVIL,ZESTRIL) 20 MG tablet Take 20 mg by mouth every morning.     Allergies:   Patient has no known allergies.   Social History   Tobacco Use  . Smoking status: Never Smoker  . Smokeless tobacco: Never Used  Substance Use Topics  . Alcohol use: Yes    Alcohol/week: 1.0 standard drink    Types: 1 Cans of beer per week    Comment: occ. weekends  . Drug use: No     Family Hx: The patient's family history includes Heart attack in his cousin.  ROS:   Please see the history of present illness.     All other systems reviewed and are negative.   Prior CV studies:   The following studies were reviewed today:  ETT: 02/28/2019  Blood pressure demonstrated a normal response to exercise.  Upsloping ST segment depression ST segment depression was noted during stress, and returning to baseline after 1-5 minutes of recovery.  Frequent PVCs during exercise and into recovery.   Negative adequate for ST changes of ischemia, but very concerning increase in frequency of PVCs with exercise, up to 37/minute, which persisted into recovery.  Cardiac Catheterization: 03/14/2019  Normal left ventricular systolic function, EF 65%.  Normal LVEDP.  Right dominant coronary anatomy.  Normal left main  Normal LAD.  Just distal to the second diagonal in the mid LAD there is 60% systolic compression with normal lumen during diastole.  Circumflex is normal.  Right coronary is normal and dominant.  RECOMMENDATIONS:   Further evaluation per Dr. Eden Emms  False positive EKG changes for ischemia versus microvascular coronary disease.  Labs/Other Tests and Data Reviewed:    EKG:  No ECG reviewed.  Recent Labs: No results found for requested labs within last 8760 hours.   Recent Lipid  Panel No results found for: CHOL, TRIG, HDL, CHOLHDL, LDLCALC, LDLDIRECT  Wt Readings from Last 3 Encounters:  10/13/20 74.8 kg  04/12/19 74.8 kg  03/14/19 74.8 kg     Objective:    Vital Signs:  Ht 5\' 6"  (1.676 m)   Wt 74.8 kg   BMI 26.63 kg/m    Telephone no exam    ASSESSMENT & PLAN:    1. Chest Pain - Cath with no significant CAD 03/14/19 only distal LAD bridging Continue risk factor modification   2. HTN - Well controlled.  Continue current medications and low sodium Dash type diet.    3. HLD - continue statin labs with primary next month   COVID-19 Education: The signs and symptoms of COVID-19 were discussed with the patient and how to seek care for testing (follow up with PCP or arrange E-visit).  The importance of social distancing was discussed today.  Time:   Today, I have spent 20 minutes with the patient with telehealth technology discussing the above problems reviewing epic notes cath films and stress test along with composing note    Medication Adjustments/Labs and Tests Ordered: Current medicines are reviewed at length with the patient today.  Concerns regarding medicines are outlined above.   Tests Ordered: No orders of the defined types were placed in this encounter.   Medication Changes: No orders of the defined types were placed in this encounter.   Follow Up: In 1 year  Signed, 03/16/19, MD  10/13/2020 9:22 AM    [facility]

## 2021-04-07 IMAGING — DX DG CHEST 2V
2 series · 2 of 2 positions shown · non-contrast
Comparison: None.

CLINICAL DATA: Chest pain

EXAM:
CHEST - 2 VIEW

[chest pa]
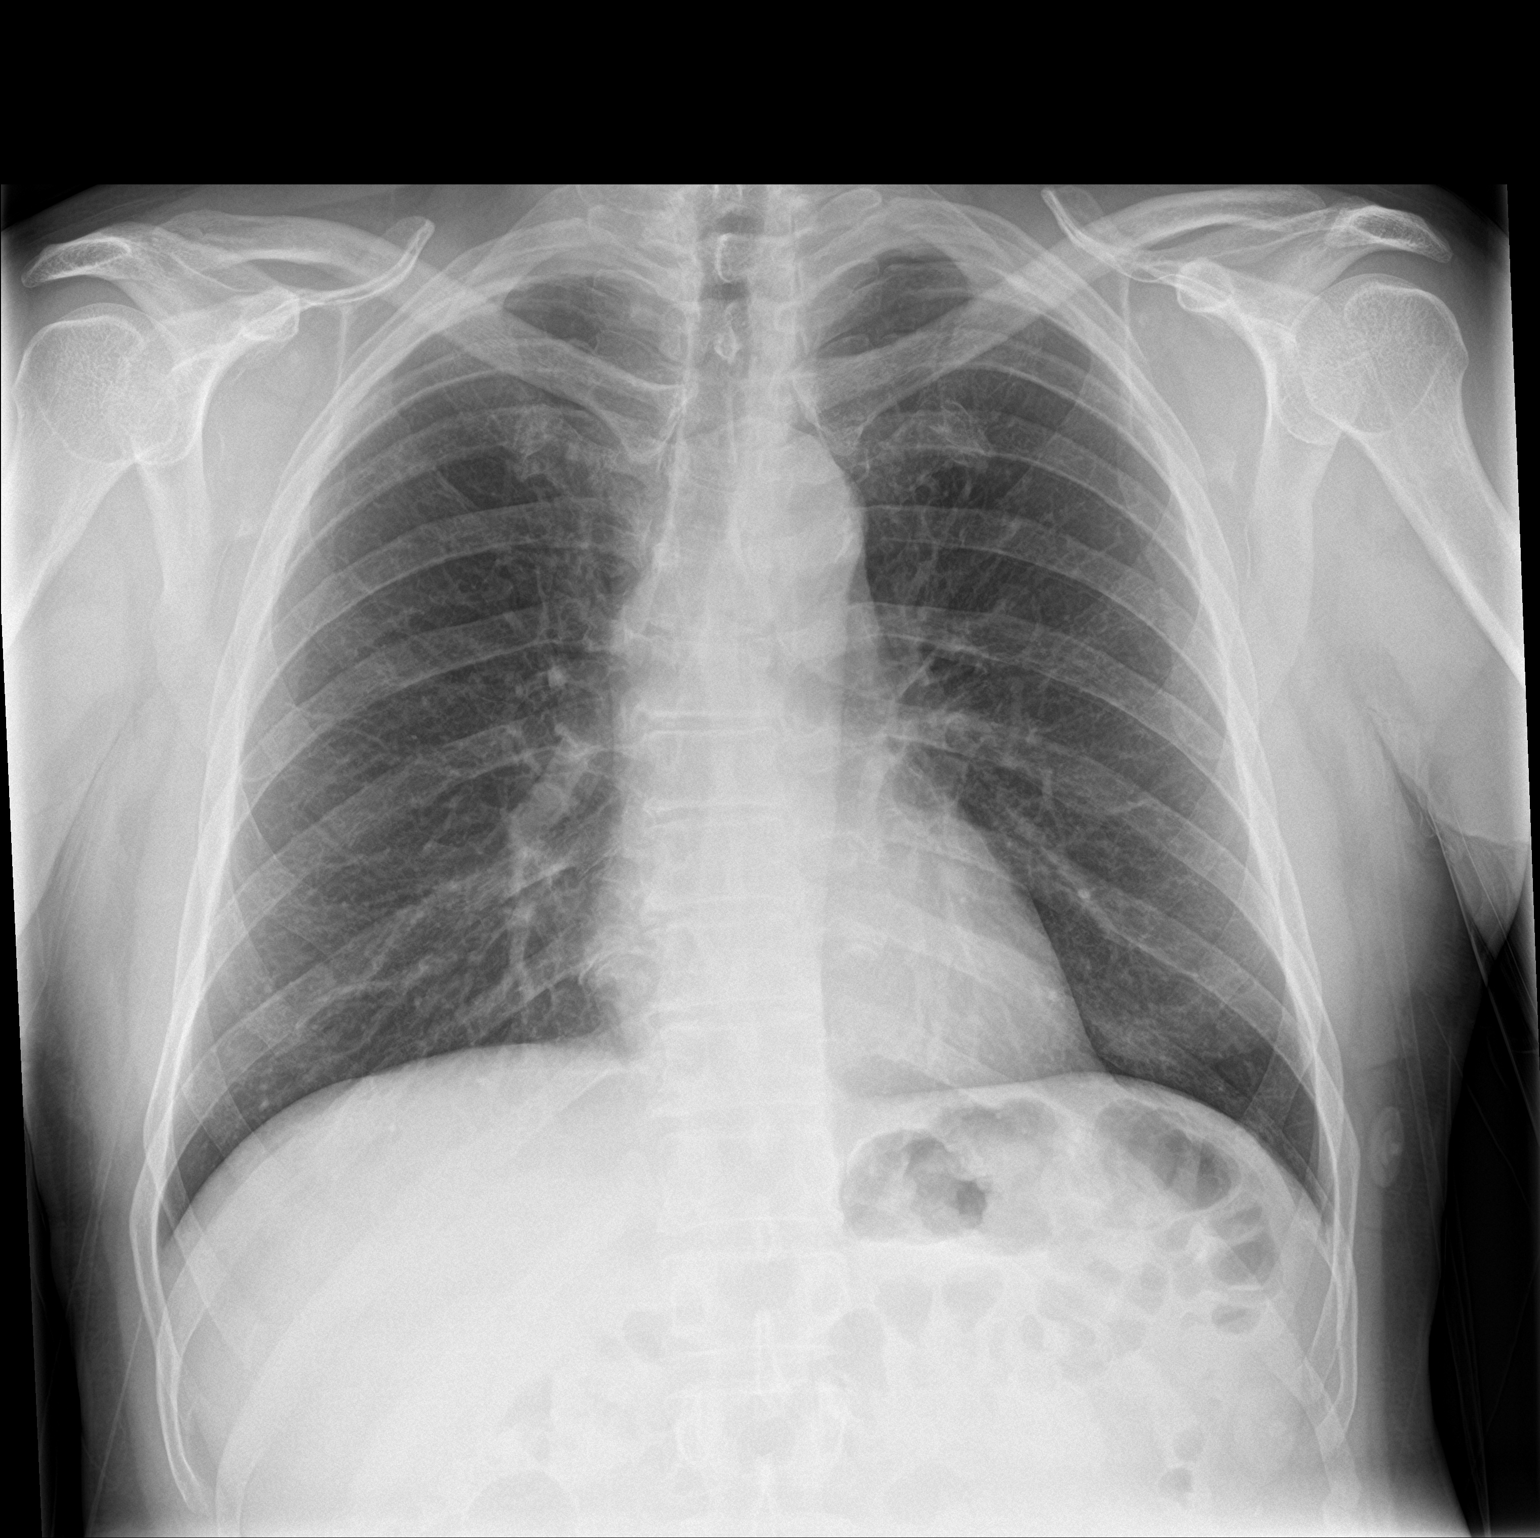

[chest lat]
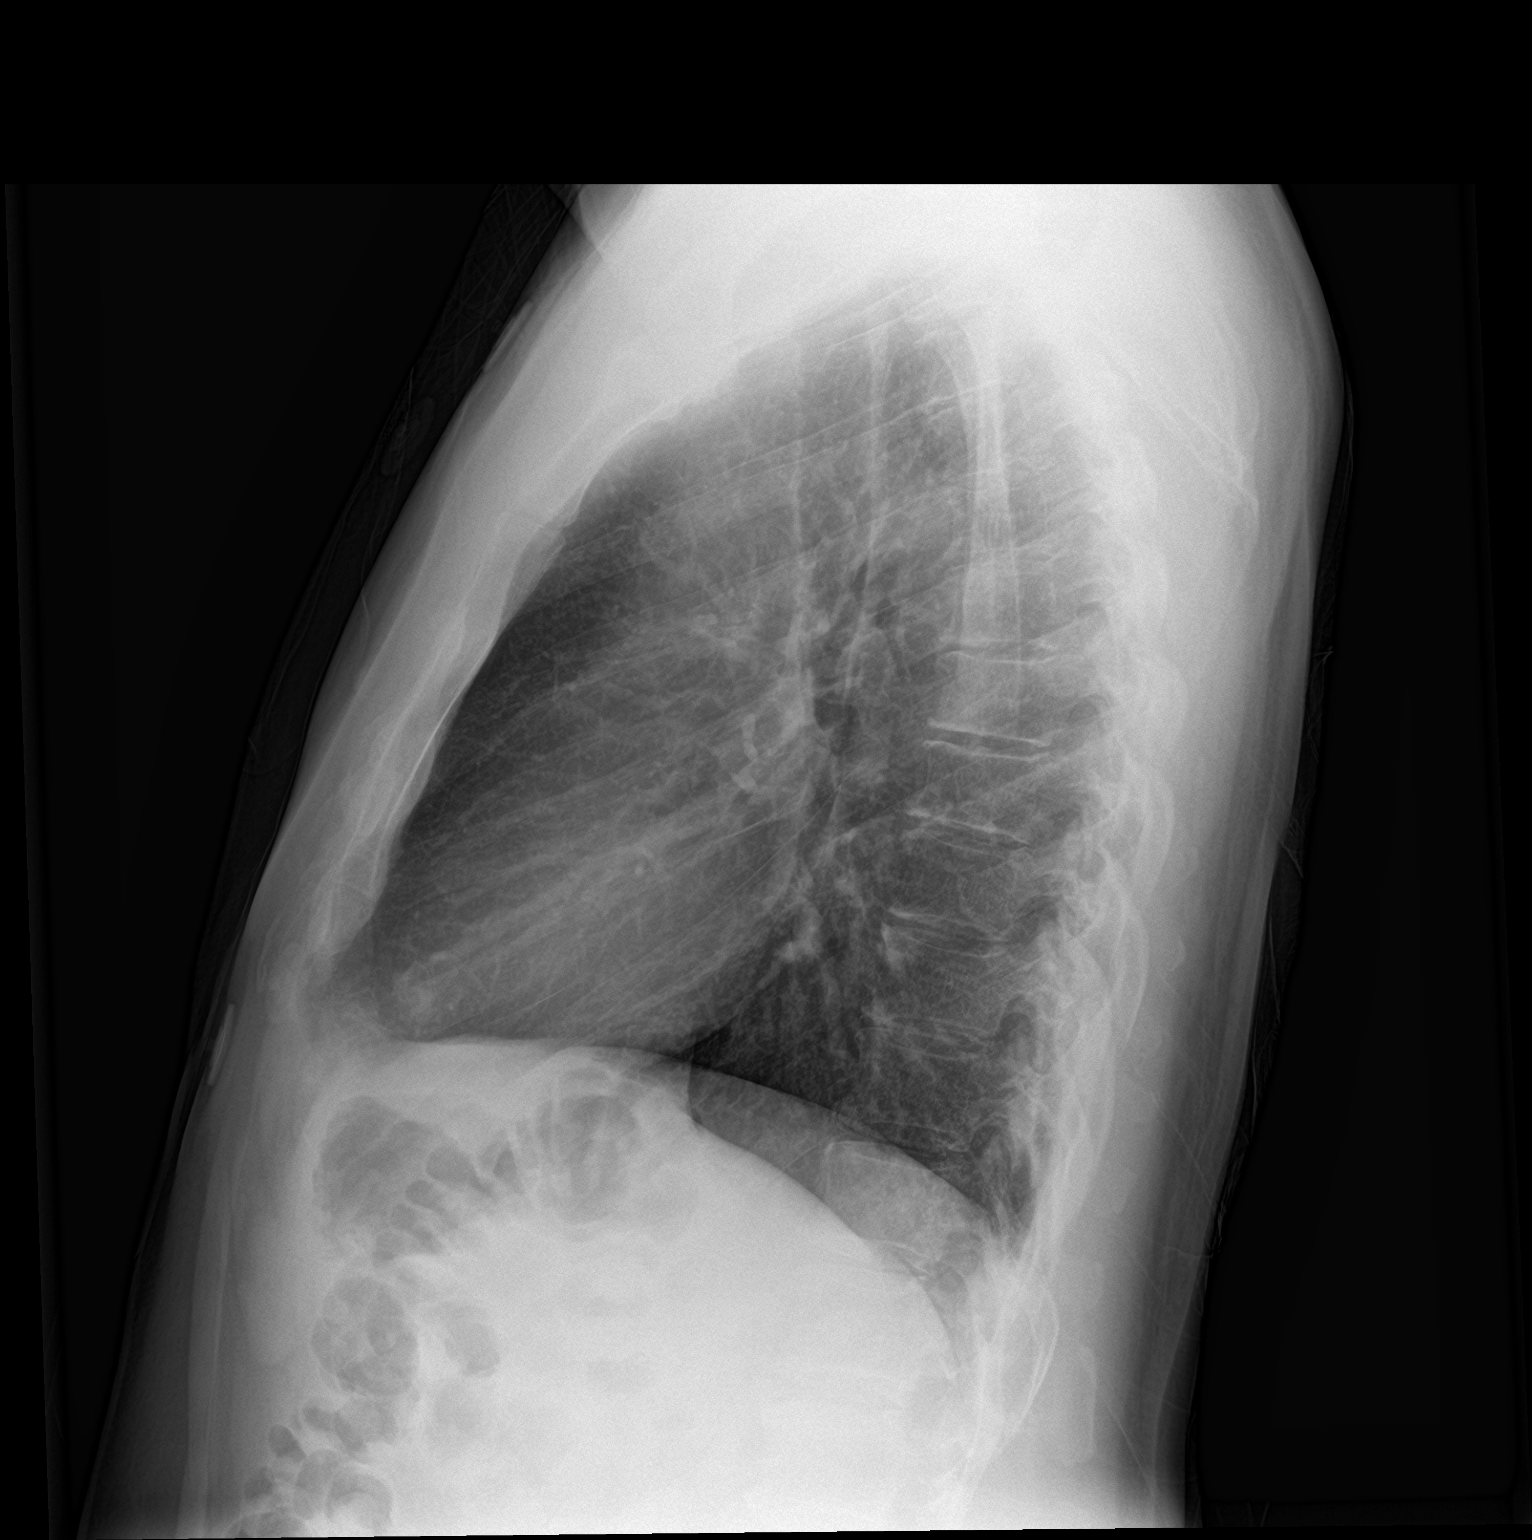

[2 of 2 positions shown; findings below may reference images not displayed]

FINDINGS: The heart size and mediastinal contours are within normal limits.
Both lungs are clear. The visualized skeletal structures are
unremarkable.
IMPRESSION: No active cardiopulmonary disease.
